# Patient Record
Sex: Male | Born: 1955 | Race: Black or African American | Hispanic: No | Marital: Single
Health system: Southern US, Community
[De-identification: ages and names within clinical notes are randomized; demographics above are authoritative.]

## PROBLEM LIST (undated history)

## (undated) DIAGNOSIS — I10 Essential (primary) hypertension: Secondary | ICD-10-CM

---

## 2007-10-16 ENCOUNTER — Emergency Department (HOSPITAL_COMMUNITY): Admission: EM | Admit: 2007-10-16 | Discharge: 2007-10-16 | Payer: Self-pay | Admitting: Emergency Medicine

## 2007-10-18 ENCOUNTER — Emergency Department (HOSPITAL_COMMUNITY): Admission: EM | Admit: 2007-10-18 | Discharge: 2007-10-18 | Payer: Self-pay | Admitting: Emergency Medicine

## 2009-10-02 ENCOUNTER — Emergency Department (HOSPITAL_COMMUNITY): Admission: EM | Admit: 2009-10-02 | Discharge: 2009-10-02 | Payer: Self-pay | Admitting: Emergency Medicine

## 2012-08-04 ENCOUNTER — Emergency Department (HOSPITAL_COMMUNITY): Payer: Self-pay

## 2012-08-04 ENCOUNTER — Encounter (HOSPITAL_COMMUNITY): Payer: Self-pay | Admitting: *Deleted

## 2012-08-04 ENCOUNTER — Emergency Department (HOSPITAL_COMMUNITY)
Admission: EM | Admit: 2012-08-04 | Discharge: 2012-08-04 | Disposition: A | Discharge status: Home / Self Care | Payer: Self-pay | Attending: Emergency Medicine | Admitting: Emergency Medicine

## 2012-08-04 DIAGNOSIS — Z79899 Other long term (current) drug therapy: Secondary | ICD-10-CM | POA: Insufficient documentation

## 2012-08-04 DIAGNOSIS — I4892 Unspecified atrial flutter: Secondary | ICD-10-CM | POA: Insufficient documentation

## 2012-08-04 DIAGNOSIS — R0602 Shortness of breath: Secondary | ICD-10-CM | POA: Insufficient documentation

## 2012-08-04 DIAGNOSIS — I10 Essential (primary) hypertension: Secondary | ICD-10-CM | POA: Insufficient documentation

## 2012-08-04 DIAGNOSIS — Z7982 Long term (current) use of aspirin: Secondary | ICD-10-CM | POA: Insufficient documentation

## 2012-08-04 HISTORY — DX: Essential (primary) hypertension: I10

## 2012-08-04 LAB — CBC WITH DIFFERENTIAL/PLATELET
Basophils Absolute: 0 10*3/uL (ref 0.0–0.1)
Basophils Relative: 0 % (ref 0–1)
Eosinophils Absolute: 0 10*3/uL (ref 0.0–0.7)
Eosinophils Relative: 0 % (ref 0–5)
HCT: 43.2 % (ref 39.0–52.0)
Hemoglobin: 15.4 g/dL (ref 13.0–17.0)
Lymphocytes Relative: 9 % — ABNORMAL LOW (ref 12–46)
Lymphs Abs: 0.7 10*3/uL (ref 0.7–4.0)
MCH: 29.8 pg (ref 26.0–34.0)
MCHC: 35.6 g/dL (ref 30.0–36.0)
MCV: 83.6 fL (ref 78.0–100.0)
Monocytes Absolute: 0.7 10*3/uL (ref 0.1–1.0)
Monocytes Relative: 9 % (ref 3–12)
Neutro Abs: 6.5 10*3/uL (ref 1.7–7.7)
Neutrophils Relative %: 82 % — ABNORMAL HIGH (ref 43–77)
Platelets: 168 10*3/uL (ref 150–400)
RBC: 5.17 MIL/uL (ref 4.22–5.81)
RDW: 12.3 % (ref 11.5–15.5)
WBC: 8 10*3/uL (ref 4.0–10.5)

## 2012-08-04 LAB — URINALYSIS, ROUTINE W REFLEX MICROSCOPIC
Bilirubin Urine: NEGATIVE
Glucose, UA: NEGATIVE mg/dL
Hgb urine dipstick: NEGATIVE
Ketones, ur: NEGATIVE mg/dL
Leukocytes, UA: NEGATIVE
Nitrite: NEGATIVE
Protein, ur: NEGATIVE mg/dL
Specific Gravity, Urine: 1.007 (ref 1.005–1.030)
Urobilinogen, UA: 0.2 mg/dL (ref 0.0–1.0)
pH: 7.5 (ref 5.0–8.0)

## 2012-08-04 LAB — BASIC METABOLIC PANEL
BUN: 16 mg/dL (ref 6–23)
CO2: 24 mEq/L (ref 19–32)
Calcium: 9.6 mg/dL (ref 8.4–10.5)
Chloride: 105 mEq/L (ref 96–112)
Creatinine, Ser: 1.31 mg/dL (ref 0.50–1.35)
GFR calc Af Amer: 67 mL/min — ABNORMAL LOW (ref >90)
GFR calc non Af Amer: 58 mL/min — ABNORMAL LOW (ref >90)
Glucose, Bld: 80 mg/dL (ref 70–99)
Potassium: 3.6 mEq/L (ref 3.5–5.1)
Sodium: 140 mEq/L (ref 135–145)

## 2012-08-04 LAB — TROPONIN I: Troponin I: <0.3 ng/mL (ref <0.30)

## 2012-08-04 MED ORDER — DILTIAZEM HCL ER COATED BEADS 240 MG PO TB24: 240.0000 mg | ORAL_TABLET | Freq: Every day | ORAL | Status: AC

## 2012-08-04 MED ORDER — SODIUM CHLORIDE 0.9 % IV BOLUS (SEPSIS)
1000.0000 mL | Freq: Once | INTRAVENOUS | Status: AC
  Administered 2012-08-04: 1000 mL via INTRAVENOUS

## 2012-08-04 NOTE — ED Provider Notes (Signed)
57 year old male with no significant past medical history other than hypertension who presents with a complaint of tachycardia and palpitations. He states that he gets very short of breath with this, this resolved prior to arrival but was captured on EMS rhythm strips has a regular tachycardia approximately 150 beats per minute. I. personally viewed and interpret these rhythm strips of either the signs of possible atrial flutter with 2:1 block. At this time on his EKG he has no signs of atrial flutter, he is in normal sinus rhythm, no other acute findings. He is having no symptoms at this time, he appears stable for discharge. This was discussed with the cardiologist per the physician assistant and he will be placed on low-dose Cardizem with close followup.   Medical screening examination/treatment/procedure(s) were conducted as a shared visit with non-physician practitioner(s) and myself.  I personally evaluated the patient during the encounter    Vida Roller, MD 08/04/12 [phone]

## 2012-08-04 NOTE — ED Notes (Signed)
MD at bedside. 

## 2012-08-04 NOTE — ED Provider Notes (Signed)
Medical screening examination/treatment/procedure(s) were conducted as a shared visit with non-physician practitioner(s) and myself.  I personally evaluated the patient during the encounter  Please see my separate respective documentation pertaining to this patient encounter   Vida Roller, MD 08/04/12 1535

## 2012-08-04 NOTE — ED Provider Notes (Signed)
History    CSN: [id number] Arrival date & time 08/04/12  1227  First MD Initiated Contact with Patient 08/04/12 1230     Chief Complaint  Patient presents with  . Tachycardia   (Consider location/radiation/quality/duration/timing/severity/associated sxs/prior Treatment) HPI Patient presents emergency department with an episode of shortness of breath, and tachycardia.  Patient, states he was walking to the bank, which is about a 4 1/2 mile walk and he feels, feeling short of breath, and tachycardic.  Patient, states, that he felt like he was going to pass out.  Patient, states, that he's had this off-and-on for the past year, but he states, mostly in the summertime.  Patient, states, that he did not take any medications prior to arrival.  Patient, states he did not have any chest pain, nausea, vomiting, diarrhea, abdominal pain, back pain, fever, cough, numbness, weakness, syncope, or blurred vision.  Patient, states, that resting in the past has helped with his symptoms Past Medical History  Diagnosis Date  . Hypertension    No past surgical history on file. No family history on file. History  Substance Use Topics  . Smoking status: Not on file  . Smokeless tobacco: Not on file  . Alcohol Use: Not on file    Review of Systems All other systems negative except as documented in the HPI. All pertinent positives and negatives as reviewed in the HPI. Allergies  Review of patient's allergies indicates no known allergies.  Home Medications   Current Outpatient Rx  Name  Route  Sig  Dispense  Refill  . acetaminophen (TYLENOL) 500 MG tablet   Oral   Take 1,000 mg by mouth every 6 (six) hours as needed for pain.         Marland Kitchen aspirin EC 81 MG tablet   Oral   Take 81 mg by mouth daily.         . naproxen sodium (ANAPROX) 220 MG tablet   Oral   Take 220 mg by mouth daily as needed (Back pain).          BP 159/96  Pulse 76  Temp(Src) 98.4 F (36.9 C) (Oral)  Resp 19  SpO2  96% Physical Exam  Nursing note and vitals reviewed. Constitutional: He is oriented to person, place, and time. He appears well-developed and well-nourished. No distress.  HENT:  Head: Normocephalic and atraumatic.  Mouth/Throat: Oropharynx is clear and moist. No oropharyngeal exudate.  Eyes: Pupils are equal, round, and reactive to light.  Neck: Normal range of motion. Neck supple.  Cardiovascular: Normal rate, regular rhythm and normal heart sounds.  Exam reveals no gallop and no friction rub.   No murmur heard. Pulmonary/Chest: Effort normal and breath sounds normal. No respiratory distress. He has no wheezes.  Abdominal: Soft. Bowel sounds are normal. He exhibits no distension.  Musculoskeletal: He exhibits no edema.  Neurological: He is alert and oriented to person, place, and time. He exhibits normal muscle tone. Coordination normal.  Skin: Skin is warm and dry.    ED Course  Procedures (including critical care time) Labs Reviewed  CBC WITH DIFFERENTIAL - Abnormal; Notable for the following:    Neutrophils Relative % 82 (*)    Lymphocytes Relative 9 (*)    All other components within normal limits  BASIC METABOLIC PANEL - Abnormal; Notable for the following:    GFR calc non Af Amer 58 (*)    GFR calc Af Amer 67 (*)    All other components within normal  limits  URINALYSIS, ROUTINE W REFLEX MICROSCOPIC  TROPONIN I   Dg Chest 2 View  08/04/2012   *RADIOLOGY REPORT*  Clinical Data: shortness of breath and tachycardia  CHEST - 2 VIEW  Comparison: None.  Findings: Heart size is normal.  No pleural effusion or edema.  There is no airspace consolidation identified.  Review of the visualized osseous structures is unremarkable.  IMPRESSION:  1.  No acute cardiopulmonary abnormalities.   Original Report Authenticated By: Signa Kell, M.D.   Patient was given 20 of Cardizem by EMS and total relief of his symptoms and his rate went from 130 to 160 down to the 80s.  Patient, states, that  he's had no symptoms and feels much better following this medication and fluid.  I spoke with Dr. Sharyn Lull about the patient and he feels the patient in the home if all his tests are normal, which they are the patient is ambulated in the hallway without any tachycardia or symptoms.  The patient is going to be placed on Cardizem and advised to see Dr. Sharyn Lull or Dr. Shana Chute on Monday morning.  MDM  MDM Reviewed: vitals and nursing note Interpretation: labs, ECG and x-ray Consults: cardiology   Date: 08/04/2012  Rate: 80  Rhythm: normal sinus rhythm  QRS Axis: normal  Intervals: normal  ST/T Wave abnormalities: normal  Conduction Disutrbances:none  Narrative Interpretation:   Old EKG Reviewed: changes noted  Patient had a narrow, complex tachycardia on EKG from EMS    Carlyle Dolly, PA-C 08/04/12 1520

## 2012-08-04 NOTE — ED Notes (Signed)
Pt ambulated in the hallway for several minutes.  His baseline HR was NSR 80-85 bpm.  During ambulation his HR went to 95 BPM NSR.  No distress, denies any CP, palpitations, or dizziness.

## 2012-08-04 NOTE — ED Notes (Signed)
Per report from [facility] pt was out walking and was attempting to go to the bank.  While at the bank he began to feel weak and had a near syncopal episode that didn't result in a fall.  Pt did not lose consciousness.  No distress noted.  Pt was noted to be in a A flutter rhythm rate of 160.

## 2014-07-09 IMAGING — CR DG CHEST 2V
2 series · 2 of 2 positions shown · non-contrast
Comparison: None.

CLINICAL DATA: shortness of breath and tachycardia

CHEST - 2 VIEW

[w chest pa]
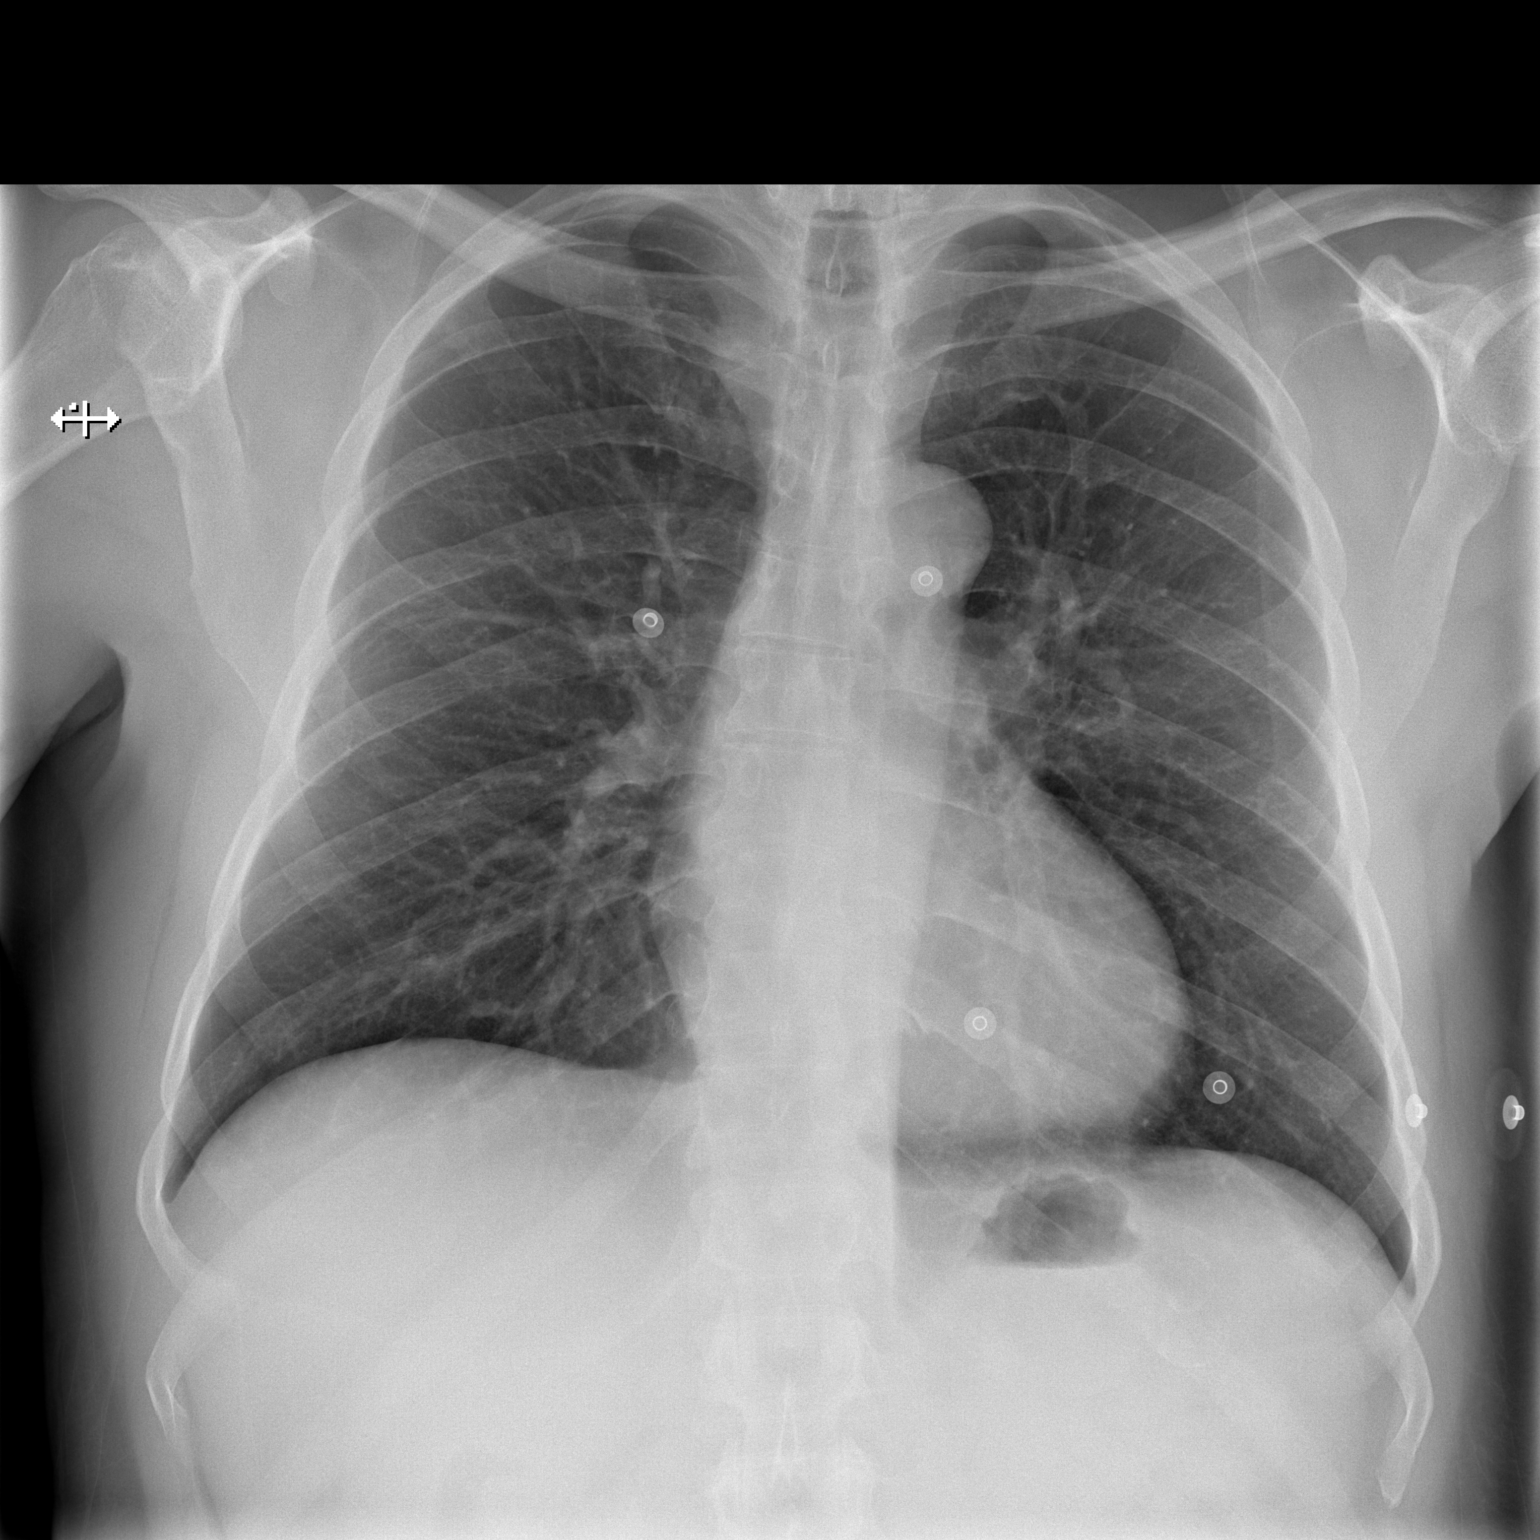

[w chest lat]
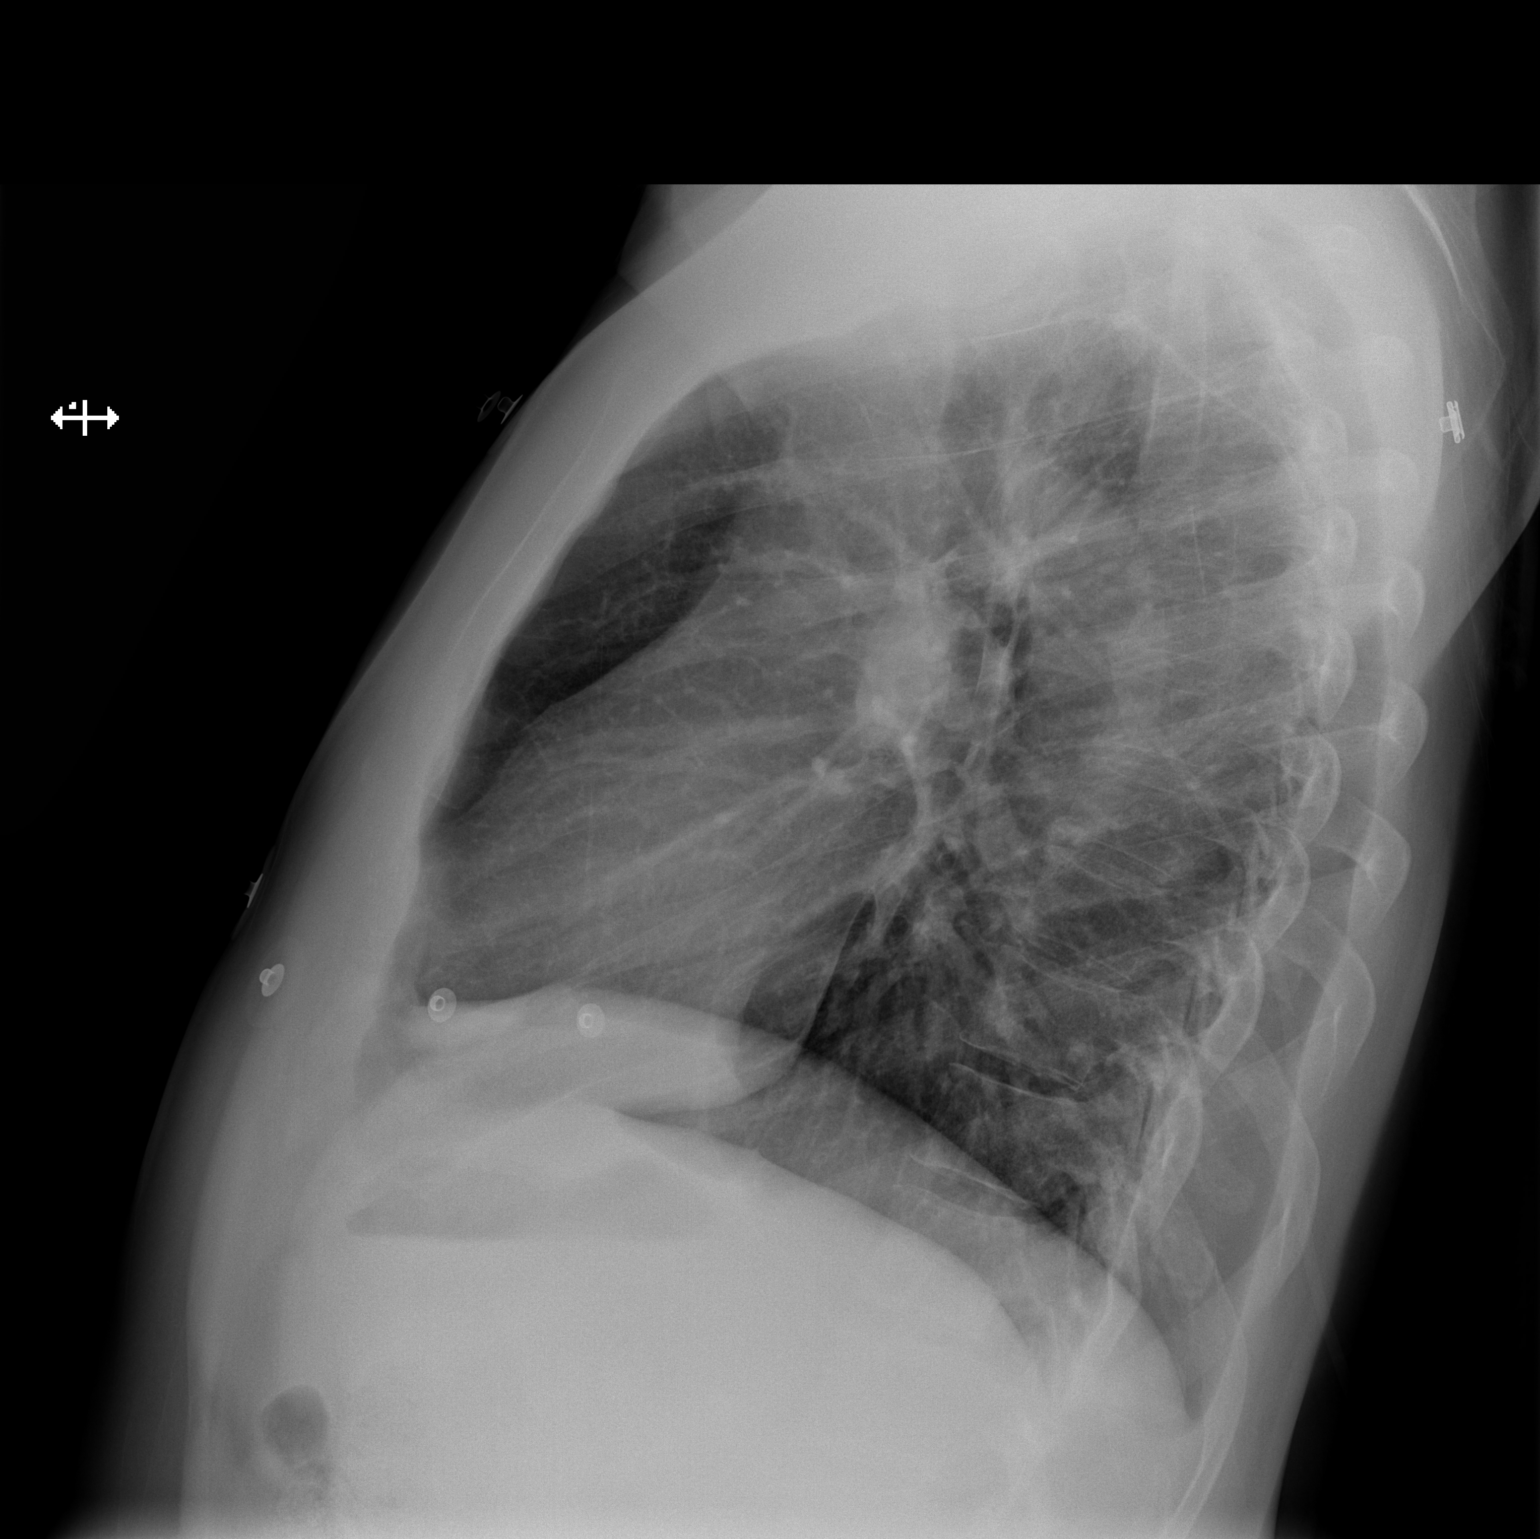

[2 of 2 positions shown; findings below may reference images not displayed]

FINDINGS: Heart size is normal.

No pleural effusion or edema.

There is no airspace consolidation identified.

Review of the visualized osseous structures is unremarkable.
IMPRESSION: 1.  No acute cardiopulmonary abnormalities.

## 2019-11-25 ENCOUNTER — Ambulatory Visit: Payer: Self-pay | Attending: Internal Medicine

## 2019-11-25 DIAGNOSIS — Z23 Encounter for immunization: Secondary | ICD-10-CM

## 2019-11-25 NOTE — Progress Notes (Signed)
   Covid-[facility] Vaccination [facility]  Name:  Anthony Greer    MRN: [id number] DOB: 12/27/1955  11/25/2019  Anthony Greer was observed post Covid-19 immunization for 15 minutes without incident. He was provided with Vaccine Information Sheet and instruction to access the V-Safe system.   Anthony Greer was instructed to call 911 with any severe reactions post vaccine: Marland Kitchen Difficulty breathing  . Swelling of face and throat  . A fast heartbeat  . A bad rash all over body  . Dizziness and weakness
